# Patient Record
Sex: Female | Born: 1965 | Race: White | Hispanic: No | Smoking: Never smoker
Health system: Southern US, Community
[De-identification: ages and names within clinical notes are randomized; demographics above are authoritative.]

## PROBLEM LIST (undated history)

## (undated) DIAGNOSIS — F419 Anxiety disorder, unspecified: Secondary | ICD-10-CM

## (undated) DIAGNOSIS — F329 Major depressive disorder, single episode, unspecified: Secondary | ICD-10-CM

## (undated) DIAGNOSIS — E039 Hypothyroidism, unspecified: Secondary | ICD-10-CM

## (undated) DIAGNOSIS — F32A Depression, unspecified: Secondary | ICD-10-CM

## (undated) DIAGNOSIS — I10 Essential (primary) hypertension: Secondary | ICD-10-CM

## (undated) HISTORY — PX: GASTRIC BYPASS: SHX52

---

## 2005-04-01 ENCOUNTER — Ambulatory Visit: Payer: Self-pay | Admitting: Internal Medicine

## 2005-04-19 ENCOUNTER — Ambulatory Visit: Payer: Self-pay | Admitting: Internal Medicine

## 2005-05-19 ENCOUNTER — Ambulatory Visit: Payer: Self-pay | Admitting: Internal Medicine

## 2006-07-17 ENCOUNTER — Emergency Department (HOSPITAL_COMMUNITY): Admission: EM | Admit: 2006-07-17 | Discharge: 2006-07-17 | Payer: Self-pay | Admitting: Emergency Medicine

## 2006-07-22 ENCOUNTER — Emergency Department: Payer: Self-pay | Admitting: Emergency Medicine

## 2006-09-10 ENCOUNTER — Inpatient Hospital Stay: Payer: Self-pay | Admitting: Internal Medicine

## 2010-10-13 ENCOUNTER — Emergency Department (HOSPITAL_COMMUNITY)
Admission: EM | Admit: 2010-10-13 | Discharge: 2010-10-14 | Disposition: A | Discharge status: Home / Self Care | Payer: No Typology Code available for payment source | Attending: General Surgery | Admitting: General Surgery

## 2010-10-13 ENCOUNTER — Emergency Department (HOSPITAL_COMMUNITY): Payer: No Typology Code available for payment source

## 2010-10-13 DIAGNOSIS — E119 Type 2 diabetes mellitus without complications: Secondary | ICD-10-CM | POA: Insufficient documentation

## 2010-10-13 DIAGNOSIS — Y992 Volunteer activity: Secondary | ICD-10-CM | POA: Insufficient documentation

## 2010-10-13 DIAGNOSIS — Z9884 Bariatric surgery status: Secondary | ICD-10-CM | POA: Insufficient documentation

## 2010-10-13 DIAGNOSIS — F3289 Other specified depressive episodes: Secondary | ICD-10-CM | POA: Insufficient documentation

## 2010-10-13 DIAGNOSIS — E039 Hypothyroidism, unspecified: Secondary | ICD-10-CM | POA: Insufficient documentation

## 2010-10-13 DIAGNOSIS — N390 Urinary tract infection, site not specified: Secondary | ICD-10-CM | POA: Insufficient documentation

## 2010-10-13 DIAGNOSIS — S0003XA Contusion of scalp, initial encounter: Secondary | ICD-10-CM | POA: Insufficient documentation

## 2010-10-13 DIAGNOSIS — I10 Essential (primary) hypertension: Secondary | ICD-10-CM | POA: Insufficient documentation

## 2010-10-13 DIAGNOSIS — K219 Gastro-esophageal reflux disease without esophagitis: Secondary | ICD-10-CM | POA: Insufficient documentation

## 2010-10-13 DIAGNOSIS — E282 Polycystic ovarian syndrome: Secondary | ICD-10-CM | POA: Insufficient documentation

## 2010-10-13 DIAGNOSIS — F329 Major depressive disorder, single episode, unspecified: Secondary | ICD-10-CM | POA: Insufficient documentation

## 2010-10-13 DIAGNOSIS — S1093XA Contusion of unspecified part of neck, initial encounter: Secondary | ICD-10-CM | POA: Insufficient documentation

## 2010-10-13 DIAGNOSIS — M79609 Pain in unspecified limb: Secondary | ICD-10-CM | POA: Insufficient documentation

## 2010-10-13 DIAGNOSIS — Y92410 Unspecified street and highway as the place of occurrence of the external cause: Secondary | ICD-10-CM | POA: Insufficient documentation

## 2010-10-13 DIAGNOSIS — R109 Unspecified abdominal pain: Secondary | ICD-10-CM | POA: Insufficient documentation

## 2010-10-13 HISTORY — DX: Essential (primary) hypertension: I10

## 2010-10-14 ENCOUNTER — Emergency Department (HOSPITAL_COMMUNITY): Payer: No Typology Code available for payment source

## 2010-10-14 ENCOUNTER — Encounter (HOSPITAL_COMMUNITY): Payer: Self-pay | Admitting: Radiology

## 2010-10-14 LAB — URINALYSIS, ROUTINE W REFLEX MICROSCOPIC
Bilirubin Urine: NEGATIVE
Nitrite: POSITIVE — AB
Protein, ur: NEGATIVE mg/dL
Urobilinogen, UA: 0.2 mg/dL (ref 0.0–1.0)

## 2010-10-14 LAB — POCT I-STAT 3, VENOUS BLOOD GAS (G3P V)
Acid-base deficit: 1 mmol/L (ref 0.0–2.0)
O2 Saturation: 46 %
pCO2, Ven: 50.1 mmHg — ABNORMAL HIGH (ref 45.0–50.0)

## 2010-10-14 LAB — LACTIC ACID, PLASMA: Lactic Acid, Venous: 2.9 mmol/L — ABNORMAL HIGH (ref 0.5–2.2)

## 2010-10-14 LAB — GLUCOSE, CAPILLARY
Glucose-Capillary: 233 mg/dL — ABNORMAL HIGH (ref 70–99)
Glucose-Capillary: 352 mg/dL — ABNORMAL HIGH (ref 70–99)

## 2010-10-14 LAB — CBC
HCT: 29.8 % — ABNORMAL LOW (ref 36.0–46.0)
MCHC: 30.5 g/dL (ref 30.0–36.0)
Platelets: 384 10*3/uL (ref 150–400)
RDW: 16.7 % — ABNORMAL HIGH (ref 11.5–15.5)
WBC: 10 10*3/uL (ref 4.0–10.5)

## 2010-10-14 LAB — COMPREHENSIVE METABOLIC PANEL
AST: 17 U/L (ref 0–37)
Albumin: 2.7 g/dL — ABNORMAL LOW (ref 3.5–5.2)
BUN: 11 mg/dL (ref 6–23)
Calcium: 8.4 mg/dL (ref 8.4–10.5)
Chloride: 101 mEq/L (ref 96–112)
Creatinine, Ser: 0.92 mg/dL (ref 0.50–1.10)
Total Bilirubin: 0.1 mg/dL — ABNORMAL LOW (ref 0.3–1.2)

## 2010-10-14 LAB — DIFFERENTIAL
Basophils Absolute: 0 10*3/uL (ref 0.0–0.1)
Eosinophils Absolute: 0.2 10*3/uL (ref 0.0–0.7)
Eosinophils Relative: 2 % (ref 0–5)
Lymphs Abs: 2.4 10*3/uL (ref 0.7–4.0)
Monocytes Absolute: 0.6 10*3/uL (ref 0.1–1.0)
Neutrophils Relative %: 68 % (ref 43–77)

## 2010-10-14 LAB — POCT I-STAT TROPONIN I: Troponin i, poc: 0.01 ng/mL (ref 0.00–0.08)

## 2010-10-14 LAB — POCT PREGNANCY, URINE: Preg Test, Ur: NEGATIVE

## 2010-10-14 MED ORDER — IOHEXOL 300 MG/ML  SOLN
100.0000 mL | Freq: Once | INTRAMUSCULAR | Status: AC | PRN
  Administered 2010-10-14: 100 mL via INTRAVENOUS

## 2010-10-14 MED ORDER — IOHEXOL 300 MG/ML  SOLN: 100.0000 mL | Freq: Once | INTRAMUSCULAR | Status: DC | PRN

## 2012-05-12 ENCOUNTER — Ambulatory Visit: Payer: Self-pay | Admitting: General Practice

## 2012-05-19 ENCOUNTER — Ambulatory Visit: Payer: Self-pay | Admitting: General Practice

## 2012-06-07 ENCOUNTER — Emergency Department: Payer: Self-pay | Admitting: Emergency Medicine

## 2013-06-21 ENCOUNTER — Emergency Department: Payer: Self-pay | Admitting: Emergency Medicine

## 2013-09-23 ENCOUNTER — Encounter (HOSPITAL_COMMUNITY): Payer: No Typology Code available for payment source | Admitting: Anesthesiology

## 2013-09-23 ENCOUNTER — Inpatient Hospital Stay (HOSPITAL_COMMUNITY): Payer: No Typology Code available for payment source

## 2013-09-23 ENCOUNTER — Encounter (HOSPITAL_COMMUNITY)
Admission: EM | Disposition: A | Discharge status: Skilled Nursing Facility | Payer: Self-pay | Source: Home / Self Care | Attending: Neurosurgery

## 2013-09-23 ENCOUNTER — Inpatient Hospital Stay (HOSPITAL_COMMUNITY): Payer: No Typology Code available for payment source | Admitting: Anesthesiology

## 2013-09-23 ENCOUNTER — Emergency Department (HOSPITAL_COMMUNITY): Payer: No Typology Code available for payment source

## 2013-09-23 ENCOUNTER — Encounter (HOSPITAL_COMMUNITY): Payer: Self-pay | Admitting: Emergency Medicine

## 2013-09-23 ENCOUNTER — Inpatient Hospital Stay (HOSPITAL_COMMUNITY)
Admission: EM | Admit: 2013-09-23 | Discharge: 2013-09-28 | DRG: 460 | Disposition: A | Discharge status: Skilled Nursing Facility | Payer: No Typology Code available for payment source | Attending: Neurosurgery | Admitting: Neurosurgery

## 2013-09-23 DIAGNOSIS — E1065 Type 1 diabetes mellitus with hyperglycemia: Secondary | ICD-10-CM | POA: Diagnosis present

## 2013-09-23 DIAGNOSIS — S1091XA Abrasion of unspecified part of neck, initial encounter: Secondary | ICD-10-CM

## 2013-09-23 DIAGNOSIS — S32019A Unspecified fracture of first lumbar vertebra, initial encounter for closed fracture: Secondary | ICD-10-CM | POA: Diagnosis present

## 2013-09-23 DIAGNOSIS — F329 Major depressive disorder, single episode, unspecified: Secondary | ICD-10-CM

## 2013-09-23 DIAGNOSIS — E039 Hypothyroidism, unspecified: Secondary | ICD-10-CM | POA: Diagnosis present

## 2013-09-23 DIAGNOSIS — Z833 Family history of diabetes mellitus: Secondary | ICD-10-CM

## 2013-09-23 DIAGNOSIS — H3500 Unspecified background retinopathy: Secondary | ICD-10-CM | POA: Diagnosis present

## 2013-09-23 DIAGNOSIS — Z91199 Patient's noncompliance with other medical treatment and regimen due to unspecified reason: Secondary | ICD-10-CM

## 2013-09-23 DIAGNOSIS — Z598 Other problems related to housing and economic circumstances: Secondary | ICD-10-CM

## 2013-09-23 DIAGNOSIS — Z6841 Body Mass Index (BMI) 40.0 and over, adult: Secondary | ICD-10-CM | POA: Diagnosis not present

## 2013-09-23 DIAGNOSIS — Z820 Family history of epilepsy and other diseases of the nervous system: Secondary | ICD-10-CM | POA: Diagnosis not present

## 2013-09-23 DIAGNOSIS — N390 Urinary tract infection, site not specified: Secondary | ICD-10-CM | POA: Diagnosis present

## 2013-09-23 DIAGNOSIS — Z880 Allergy status to penicillin: Secondary | ICD-10-CM

## 2013-09-23 DIAGNOSIS — M542 Cervicalgia: Secondary | ICD-10-CM

## 2013-09-23 DIAGNOSIS — F3289 Other specified depressive episodes: Secondary | ICD-10-CM | POA: Diagnosis present

## 2013-09-23 DIAGNOSIS — I10 Essential (primary) hypertension: Secondary | ICD-10-CM | POA: Diagnosis present

## 2013-09-23 DIAGNOSIS — E1069 Type 1 diabetes mellitus with other specified complication: Secondary | ICD-10-CM

## 2013-09-23 DIAGNOSIS — IMO0002 Reserved for concepts with insufficient information to code with codable children: Secondary | ICD-10-CM | POA: Diagnosis present

## 2013-09-23 DIAGNOSIS — F32A Depression, unspecified: Secondary | ICD-10-CM | POA: Diagnosis present

## 2013-09-23 DIAGNOSIS — Z9119 Patient's noncompliance with other medical treatment and regimen: Secondary | ICD-10-CM

## 2013-09-23 DIAGNOSIS — E162 Hypoglycemia, unspecified: Secondary | ICD-10-CM | POA: Diagnosis present

## 2013-09-23 DIAGNOSIS — S32009A Unspecified fracture of unspecified lumbar vertebra, initial encounter for closed fracture: Secondary | ICD-10-CM | POA: Diagnosis present

## 2013-09-23 DIAGNOSIS — Z9884 Bariatric surgery status: Secondary | ICD-10-CM | POA: Diagnosis not present

## 2013-09-23 DIAGNOSIS — F411 Generalized anxiety disorder: Secondary | ICD-10-CM | POA: Diagnosis present

## 2013-09-23 DIAGNOSIS — E108 Type 1 diabetes mellitus with unspecified complications: Secondary | ICD-10-CM

## 2013-09-23 DIAGNOSIS — Z8249 Family history of ischemic heart disease and other diseases of the circulatory system: Secondary | ICD-10-CM

## 2013-09-23 DIAGNOSIS — Z5987 Material hardship due to limited financial resources, not elsewhere classified: Secondary | ICD-10-CM

## 2013-09-23 DIAGNOSIS — E103299 Type 1 diabetes mellitus with mild nonproliferative diabetic retinopathy without macular edema, unspecified eye: Secondary | ICD-10-CM

## 2013-09-23 HISTORY — DX: Hypothyroidism, unspecified: E03.9

## 2013-09-23 HISTORY — DX: Anxiety disorder, unspecified: F41.9

## 2013-09-23 HISTORY — PX: POSTERIOR LUMBAR FUSION 4 LEVEL: SHX6037

## 2013-09-23 HISTORY — DX: Depression, unspecified: F32.A

## 2013-09-23 HISTORY — DX: Major depressive disorder, single episode, unspecified: F32.9

## 2013-09-23 LAB — BASIC METABOLIC PANEL
Anion gap: 14 (ref 5–15)
BUN: 13 mg/dL (ref 6–23)
CO2: 23 meq/L (ref 19–32)
Calcium: 8.7 mg/dL (ref 8.4–10.5)
Chloride: 95 mEq/L — ABNORMAL LOW (ref 96–112)
Creatinine, Ser: 0.94 mg/dL (ref 0.50–1.10)
GFR calc Af Amer: 84 mL/min — ABNORMAL LOW (ref >90)
GFR, EST NON AFRICAN AMERICAN: 72 mL/min — AB (ref >90)
Glucose, Bld: 258 mg/dL — ABNORMAL HIGH (ref 70–99)
POTASSIUM: 3.8 meq/L (ref 3.7–5.3)
Sodium: 132 mEq/L — ABNORMAL LOW (ref 137–147)

## 2013-09-23 LAB — CBG MONITORING, ED
GLUCOSE-CAPILLARY: 243 mg/dL — AB (ref 70–99)
GLUCOSE-CAPILLARY: 245 mg/dL — AB (ref 70–99)
GLUCOSE-CAPILLARY: 183 mg/dL — AB (ref 70–99)
GLUCOSE-CAPILLARY: 236 mg/dL — AB (ref 70–99)
Glucose-Capillary: 185 mg/dL — ABNORMAL HIGH (ref 70–99)
Glucose-Capillary: 183 mg/dL — ABNORMAL HIGH (ref 70–99)

## 2013-09-23 LAB — CBC WITH DIFFERENTIAL/PLATELET
BASOS ABS: 0 10*3/uL (ref 0.0–0.1)
Basophils Relative: 0 % (ref 0–1)
Eosinophils Absolute: 0 10*3/uL (ref 0.0–0.7)
Eosinophils Relative: 0 % (ref 0–5)
HEMATOCRIT: 32.7 % — AB (ref 36.0–46.0)
Hemoglobin: 10.4 g/dL — ABNORMAL LOW (ref 12.0–15.0)
LYMPHS PCT: 9 % — AB (ref 12–46)
Lymphs Abs: 0.9 10*3/uL (ref 0.7–4.0)
MCH: 22.9 pg — ABNORMAL LOW (ref 26.0–34.0)
MCHC: 31.8 g/dL (ref 30.0–36.0)
MCV: 72 fL — AB (ref 78.0–100.0)
Monocytes Absolute: 0.5 10*3/uL (ref 0.1–1.0)
Monocytes Relative: 4 % (ref 3–12)
NEUTROS ABS: 9.3 10*3/uL — AB (ref 1.7–7.7)
Neutrophils Relative %: 87 % — ABNORMAL HIGH (ref 43–77)
PLATELETS: 363 10*3/uL (ref 150–400)
RBC: 4.54 MIL/uL (ref 3.87–5.11)
RDW: 18.1 % — AB (ref 11.5–15.5)
WBC: 10.8 10*3/uL — AB (ref 4.0–10.5)

## 2013-09-23 LAB — PROTIME-INR
INR: 1.08 (ref 0.00–1.49)
Prothrombin Time: 14 seconds (ref 11.6–15.2)

## 2013-09-23 LAB — PREPARE RBC (CROSSMATCH)

## 2013-09-23 LAB — GLUCOSE, CAPILLARY: GLUCOSE-CAPILLARY: 164 mg/dL — AB (ref 70–99)

## 2013-09-23 LAB — ABO/RH: ABO/RH(D): O POS

## 2013-09-23 SURGERY — POSTERIOR LUMBAR FUSION 4 LEVEL
Anesthesia: General | Site: Back

## 2013-09-23 MED ORDER — BUPIVACAINE HCL (PF) 0.25 % IJ SOLN
INTRAMUSCULAR | Status: DC | PRN
Start: 2013-09-23 — End: 2013-09-23
  Administered 2013-09-23: 20 mL

## 2013-09-23 MED ORDER — ROCURONIUM BROMIDE 50 MG/5ML IV SOLN
INTRAVENOUS | Status: AC
  Filled 2013-09-23: qty 1

## 2013-09-23 MED ORDER — FENTANYL CITRATE 0.05 MG/ML IJ SOLN
INTRAMUSCULAR | Status: AC
  Filled 2013-09-23: qty 5

## 2013-09-23 MED ORDER — NEOSTIGMINE METHYLSULFATE 10 MG/10ML IV SOLN
INTRAVENOUS | Status: DC | PRN
  Administered 2013-09-23: 5 mg via INTRAVENOUS

## 2013-09-23 MED ORDER — PROPOFOL 10 MG/ML IV BOLUS
INTRAVENOUS | Status: DC | PRN
  Administered 2013-09-23: 200 mg via INTRAVENOUS

## 2013-09-23 MED ORDER — LACTATED RINGERS IV SOLN: INTRAVENOUS | Status: DC | PRN

## 2013-09-23 MED ORDER — HYDROCHLOROTHIAZIDE 25 MG PO TABS
25.0000 mg | ORAL_TABLET | Freq: Every day | ORAL | Status: DC
  Administered 2013-09-24 – 2013-09-28 (×5): 25 mg via ORAL
  Filled 2013-09-23 (×5): qty 1

## 2013-09-23 MED ORDER — INSULIN ASPART 100 UNIT/ML ~~LOC~~ SOLN
8.0000 [IU] | Freq: Once | SUBCUTANEOUS | Status: AC
  Administered 2013-09-23: 8 [IU] via SUBCUTANEOUS

## 2013-09-23 MED ORDER — HYDROMORPHONE HCL PF 1 MG/ML IJ SOLN
1.0000 mg | Freq: Once | INTRAMUSCULAR | Status: AC
  Administered 2013-09-23: 1 mg via INTRAVENOUS
  Filled 2013-09-23: qty 1

## 2013-09-23 MED ORDER — INSULIN ASPART 100 UNIT/ML ~~LOC~~ SOLN: 0.0000 [IU] | Freq: Three times a day (TID) | SUBCUTANEOUS | Status: DC

## 2013-09-23 MED ORDER — INSULIN ASPART 100 UNIT/ML ~~LOC~~ SOLN
SUBCUTANEOUS | Status: DC | PRN
  Administered 2013-09-23: 10 [IU] via SUBCUTANEOUS

## 2013-09-23 MED ORDER — SODIUM CHLORIDE 0.9 % IJ SOLN
3.0000 mL | Freq: Two times a day (BID) | INTRAMUSCULAR | Status: DC
  Administered 2013-09-23: 3 mL via INTRAVENOUS

## 2013-09-23 MED ORDER — VECURONIUM BROMIDE 10 MG IV SOLR
INTRAVENOUS | Status: AC
  Filled 2013-09-23: qty 10

## 2013-09-23 MED ORDER — LACTATED RINGERS IV SOLN
INTRAVENOUS | Status: DC | PRN
  Administered 2013-09-23: 15:00:00 via INTRAVENOUS

## 2013-09-23 MED ORDER — MENTHOL 3 MG MT LOZG: 1.0000 | LOZENGE | OROMUCOSAL | Status: DC | PRN

## 2013-09-23 MED ORDER — ROCURONIUM BROMIDE 100 MG/10ML IV SOLN
INTRAVENOUS | Status: DC | PRN
  Administered 2013-09-23: 50 mg via INTRAVENOUS
  Administered 2013-09-23: 30 mg via INTRAVENOUS
  Administered 2013-09-23: 20 mg via INTRAVENOUS

## 2013-09-23 MED ORDER — INSULIN GLARGINE 100 UNIT/ML ~~LOC~~ SOLN
30.0000 [IU] | Freq: Every day | SUBCUTANEOUS | Status: DC
  Administered 2013-09-23: 30 [IU] via SUBCUTANEOUS
  Filled 2013-09-23 (×2): qty 0.3

## 2013-09-23 MED ORDER — ALUM & MAG HYDROXIDE-SIMETH 200-200-20 MG/5ML PO SUSP: 30.0000 mL | Freq: Four times a day (QID) | ORAL | Status: DC | PRN

## 2013-09-23 MED ORDER — DEXTROSE 50 % IV SOLN: 50.0000 mL | Freq: Once | INTRAVENOUS | Status: AC | PRN

## 2013-09-23 MED ORDER — SODIUM CHLORIDE 0.9 % IV SOLN: 250.0000 mL | INTRAVENOUS | Status: DC

## 2013-09-23 MED ORDER — VECURONIUM BROMIDE 10 MG IV SOLR
INTRAVENOUS | Status: DC | PRN
  Administered 2013-09-23 (×2): 2 mg via INTRAVENOUS

## 2013-09-23 MED ORDER — HYDROMORPHONE HCL PF 1 MG/ML IJ SOLN
0.5000 mg | INTRAMUSCULAR | Status: DC | PRN
  Administered 2013-09-23: 1 mg via INTRAVENOUS
  Filled 2013-09-23: qty 1

## 2013-09-23 MED ORDER — OXYCODONE-ACETAMINOPHEN 5-325 MG PO TABS
1.0000 | ORAL_TABLET | ORAL | Status: DC | PRN
  Administered 2013-09-24 – 2013-09-27 (×13): 2 via ORAL
  Administered 2013-09-27: 1 via ORAL
  Administered 2013-09-27 – 2013-09-28 (×3): 2 via ORAL
  Administered 2013-09-28: 1 via ORAL
  Filled 2013-09-23 (×18): qty 2

## 2013-09-23 MED ORDER — SODIUM CHLORIDE 0.9 % IV BOLUS (SEPSIS)
1000.0000 mL | Freq: Once | INTRAVENOUS | Status: AC
  Administered 2013-09-23: 1000 mL via INTRAVENOUS

## 2013-09-23 MED ORDER — PHENOL 1.4 % MT LIQD: 1.0000 | OROMUCOSAL | Status: DC | PRN

## 2013-09-23 MED ORDER — PANTOPRAZOLE SODIUM 40 MG PO TBEC
40.0000 mg | DELAYED_RELEASE_TABLET | Freq: Every day | ORAL | Status: DC
  Administered 2013-09-24 – 2013-09-28 (×5): 40 mg via ORAL
  Filled 2013-09-23 (×5): qty 1

## 2013-09-23 MED ORDER — HYDROMORPHONE HCL PF 1 MG/ML IJ SOLN
INTRAMUSCULAR | Status: AC
  Administered 2013-09-23: 23:00:00
  Filled 2013-09-23: qty 1

## 2013-09-23 MED ORDER — LORATADINE 10 MG PO TABS
10.0000 mg | ORAL_TABLET | Freq: Every day | ORAL | Status: DC
  Administered 2013-09-24 – 2013-09-28 (×5): 10 mg via ORAL
  Filled 2013-09-23 (×5): qty 1

## 2013-09-23 MED ORDER — MIDAZOLAM HCL 2 MG/2ML IJ SOLN
INTRAMUSCULAR | Status: AC
  Filled 2013-09-23: qty 2

## 2013-09-23 MED ORDER — ONDANSETRON HCL 4 MG/2ML IJ SOLN
INTRAMUSCULAR | Status: AC
  Filled 2013-09-23: qty 2

## 2013-09-23 MED ORDER — IOHEXOL 350 MG/ML SOLN
50.0000 mL | Freq: Once | INTRAVENOUS | Status: AC | PRN
  Administered 2013-09-23: 50 mL via INTRAVENOUS

## 2013-09-23 MED ORDER — INSULIN ASPART 100 UNIT/ML ~~LOC~~ SOLN
0.0000 [IU] | Freq: Three times a day (TID) | SUBCUTANEOUS | Status: DC
  Administered 2013-09-24 (×3): 2 [IU] via SUBCUTANEOUS
  Administered 2013-09-25: 7 [IU] via SUBCUTANEOUS
  Administered 2013-09-25: 3 [IU] via SUBCUTANEOUS
  Administered 2013-09-25: 5 [IU] via SUBCUTANEOUS
  Administered 2013-09-26: 7 [IU] via SUBCUTANEOUS

## 2013-09-23 MED ORDER — INSULIN ASPART 100 UNIT/ML ~~LOC~~ SOLN: 2.0000 [IU] | Freq: Four times a day (QID) | SUBCUTANEOUS | Status: DC | PRN

## 2013-09-23 MED ORDER — STERILE WATER FOR INJECTION IJ SOLN
INTRAMUSCULAR | Status: AC
  Filled 2013-09-23: qty 10

## 2013-09-23 MED ORDER — METOCLOPRAMIDE HCL 5 MG/ML IJ SOLN
INTRAMUSCULAR | Status: AC
  Filled 2013-09-23: qty 2

## 2013-09-23 MED ORDER — CYCLOBENZAPRINE HCL 10 MG PO TABS
10.0000 mg | ORAL_TABLET | Freq: Three times a day (TID) | ORAL | Status: DC | PRN
  Administered 2013-09-24 – 2013-09-28 (×9): 10 mg via ORAL
  Filled 2013-09-23 (×10): qty 1

## 2013-09-23 MED ORDER — CEFAZOLIN SODIUM 1-5 GM-% IV SOLN: 1.0000 g | Freq: Three times a day (TID) | INTRAVENOUS | Status: DC

## 2013-09-23 MED ORDER — VANCOMYCIN HCL IN DEXTROSE 1-5 GM/200ML-% IV SOLN
1000.0000 mg | Freq: Two times a day (BID) | INTRAVENOUS | Status: AC
  Administered 2013-09-24 – 2013-09-26 (×5): 1000 mg via INTRAVENOUS
  Filled 2013-09-23 (×5): qty 200

## 2013-09-23 MED ORDER — LIDOCAINE HCL (CARDIAC) 20 MG/ML IV SOLN
INTRAVENOUS | Status: AC
  Filled 2013-09-23: qty 10

## 2013-09-23 MED ORDER — HEMOSTATIC AGENTS (NO CHARGE) OPTIME
TOPICAL | Status: DC | PRN
  Administered 2013-09-23: 1 via TOPICAL

## 2013-09-23 MED ORDER — POTASSIUM CHLORIDE IN NACL 20-0.9 MEQ/L-% IV SOLN
INTRAVENOUS | Status: DC
  Administered 2013-09-23: 23:00:00 via INTRAVENOUS

## 2013-09-23 MED ORDER — INSULIN ASPART 100 UNIT/ML ~~LOC~~ SOLN
0.0000 [IU] | Freq: Three times a day (TID) | SUBCUTANEOUS | Status: DC
  Administered 2013-09-24 – 2013-09-26 (×6): 2 [IU] via SUBCUTANEOUS

## 2013-09-23 MED ORDER — METOCLOPRAMIDE HCL 5 MG/ML IJ SOLN
INTRAMUSCULAR | Status: DC | PRN
  Administered 2013-09-23: 10 mg via INTRAVENOUS

## 2013-09-23 MED ORDER — SODIUM CHLORIDE 0.9 % IR SOLN
Status: DC | PRN
  Administered 2013-09-23: 16:00:00

## 2013-09-23 MED ORDER — GLYCOPYRROLATE 0.2 MG/ML IJ SOLN
INTRAMUSCULAR | Status: AC
  Filled 2013-09-23: qty 2

## 2013-09-23 MED ORDER — ARTIFICIAL TEARS OP OINT
TOPICAL_OINTMENT | OPHTHALMIC | Status: AC
  Filled 2013-09-23: qty 3.5

## 2013-09-23 MED ORDER — THROMBIN 20000 UNITS EX SOLR
CUTANEOUS | Status: DC | PRN
  Administered 2013-09-23: 16:00:00 via TOPICAL

## 2013-09-23 MED ORDER — ONDANSETRON HCL 4 MG/2ML IJ SOLN: 4.0000 mg | INTRAMUSCULAR | Status: DC | PRN

## 2013-09-23 MED ORDER — OXYCODONE HCL 5 MG/5ML PO SOLN: 5.0000 mg | Freq: Once | ORAL | Status: DC | PRN

## 2013-09-23 MED ORDER — LISINOPRIL 10 MG PO TABS
10.0000 mg | ORAL_TABLET | Freq: Every day | ORAL | Status: DC
  Administered 2013-09-24 – 2013-09-28 (×5): 10 mg via ORAL
  Filled 2013-09-23 (×5): qty 1

## 2013-09-23 MED ORDER — ACETAMINOPHEN 650 MG RE SUPP: 650.0000 mg | RECTAL | Status: DC | PRN

## 2013-09-23 MED ORDER — ONDANSETRON HCL 4 MG/2ML IJ SOLN
INTRAMUSCULAR | Status: DC | PRN
  Administered 2013-09-23 (×2): 4 mg via INTRAVENOUS

## 2013-09-23 MED ORDER — VANCOMYCIN HCL IN DEXTROSE 1-5 GM/200ML-% IV SOLN
INTRAVENOUS | Status: AC
  Administered 2013-09-23: 1000 mg via INTRAVENOUS
  Filled 2013-09-23: qty 200

## 2013-09-23 MED ORDER — HYDROMORPHONE HCL PF 1 MG/ML IJ SOLN
0.5000 mg | INTRAMUSCULAR | Status: DC | PRN
  Administered 2013-09-24 – 2013-09-27 (×16): 1 mg via INTRAVENOUS
  Filled 2013-09-23 (×16): qty 1

## 2013-09-23 MED ORDER — ACETAMINOPHEN 325 MG PO TABS: 650.0000 mg | ORAL_TABLET | ORAL | Status: DC | PRN

## 2013-09-23 MED ORDER — DOCUSATE SODIUM 100 MG PO CAPS
100.0000 mg | ORAL_CAPSULE | Freq: Two times a day (BID) | ORAL | Status: DC
  Administered 2013-09-24 – 2013-09-28 (×9): 100 mg via ORAL
  Filled 2013-09-23 (×9): qty 1

## 2013-09-23 MED ORDER — SODIUM CHLORIDE 0.9 % IJ SOLN: 3.0000 mL | INTRAMUSCULAR | Status: DC | PRN

## 2013-09-23 MED ORDER — SUCCINYLCHOLINE CHLORIDE 20 MG/ML IJ SOLN
INTRAMUSCULAR | Status: DC | PRN
  Administered 2013-09-23: 140 mg via INTRAVENOUS

## 2013-09-23 MED ORDER — LIDOCAINE-EPINEPHRINE 1 %-1:100000 IJ SOLN
INTRAMUSCULAR | Status: DC | PRN
  Administered 2013-09-23 (×2): 10 mL via INTRADERMAL

## 2013-09-23 MED ORDER — GLUCOSE 40 % PO GEL: 1.0000 | ORAL | Status: DC | PRN

## 2013-09-23 MED ORDER — ACETAMINOPHEN 325 MG PO TABS
650.0000 mg | ORAL_TABLET | ORAL | Status: DC | PRN
  Administered 2013-09-27: 650 mg via ORAL
  Filled 2013-09-23: qty 2

## 2013-09-23 MED ORDER — SODIUM CHLORIDE 0.9 % IJ SOLN
3.0000 mL | INTRAMUSCULAR | Status: DC | PRN
Start: 2013-09-23 — End: 2013-09-23

## 2013-09-23 MED ORDER — GLYCOPYRROLATE 0.2 MG/ML IJ SOLN
INTRAMUSCULAR | Status: AC
  Filled 2013-09-23: qty 3

## 2013-09-23 MED ORDER — LIDOCAINE HCL (CARDIAC) 20 MG/ML IV SOLN
INTRAVENOUS | Status: DC | PRN
  Administered 2013-09-23: 100 mg via INTRAVENOUS

## 2013-09-23 MED ORDER — PROPOFOL 10 MG/ML IV BOLUS
INTRAVENOUS | Status: AC
  Filled 2013-09-23: qty 20

## 2013-09-23 MED ORDER — NEOSTIGMINE METHYLSULFATE 10 MG/10ML IV SOLN
INTRAVENOUS | Status: AC
  Filled 2013-09-23: qty 1

## 2013-09-23 MED ORDER — HYDROMORPHONE HCL PF 1 MG/ML IJ SOLN
1.0000 mg | Freq: Once | INTRAMUSCULAR | Status: AC
Start: 2013-09-23 — End: 2013-09-23
  Administered 2013-09-23: 1 mg via INTRAVENOUS
  Filled 2013-09-23: qty 1

## 2013-09-23 MED ORDER — HYDROMORPHONE HCL PF 1 MG/ML IJ SOLN
0.2500 mg | INTRAMUSCULAR | Status: DC | PRN
  Administered 2013-09-23 (×4): 0.5 mg via INTRAVENOUS

## 2013-09-23 MED ORDER — ALPRAZOLAM 0.25 MG PO TABS
0.2500 mg | ORAL_TABLET | Freq: Four times a day (QID) | ORAL | Status: DC | PRN
  Administered 2013-09-24 (×2): 0.5 mg via ORAL
  Administered 2013-09-25: 0.25 mg via ORAL
  Administered 2013-09-25: 0.5 mg via ORAL
  Administered 2013-09-25 – 2013-09-26 (×2): 0.25 mg via ORAL
  Administered 2013-09-26: 0.5 mg via ORAL
  Administered 2013-09-27: 0.25 mg via ORAL
  Administered 2013-09-27 (×2): 0.5 mg via ORAL
  Filled 2013-09-23 (×5): qty 2
  Filled 2013-09-23 (×3): qty 1
  Filled 2013-09-23: qty 2
  Filled 2013-09-23: qty 1

## 2013-09-23 MED ORDER — PHENYLEPHRINE 40 MCG/ML (10ML) SYRINGE FOR IV PUSH (FOR BLOOD PRESSURE SUPPORT)
PREFILLED_SYRINGE | INTRAVENOUS | Status: AC
  Filled 2013-09-23: qty 20

## 2013-09-23 MED ORDER — INSULIN ASPART 100 UNIT/ML ~~LOC~~ SOLN: 0.0000 [IU] | Freq: Every day | SUBCUTANEOUS | Status: DC

## 2013-09-23 MED ORDER — FENTANYL CITRATE 0.05 MG/ML IJ SOLN
INTRAMUSCULAR | Status: DC | PRN
  Administered 2013-09-23: 250 ug via INTRAVENOUS
  Administered 2013-09-23 (×2): 50 ug via INTRAVENOUS
  Administered 2013-09-23: 100 ug via INTRAVENOUS
  Administered 2013-09-23: 50 ug via INTRAVENOUS
  Administered 2013-09-23 (×2): 100 ug via INTRAVENOUS
  Administered 2013-09-23: 50 ug via INTRAVENOUS

## 2013-09-23 MED ORDER — INSULIN ASPART 100 UNIT/ML ~~LOC~~ SOLN
SUBCUTANEOUS | Status: AC
  Administered 2013-09-23: 8 [IU] via SUBCUTANEOUS
  Filled 2013-09-23: qty 1

## 2013-09-23 MED ORDER — SODIUM CHLORIDE 0.9 % IV SOLN
INTRAVENOUS | Status: DC | PRN
  Administered 2013-09-23: 14:00:00 via INTRAVENOUS

## 2013-09-23 MED ORDER — DEXTROSE 50 % IV SOLN: 25.0000 mL | Freq: Once | INTRAVENOUS | Status: AC | PRN

## 2013-09-23 MED ORDER — MIDAZOLAM HCL 5 MG/5ML IJ SOLN
INTRAMUSCULAR | Status: DC | PRN
  Administered 2013-09-23: 2 mg via INTRAVENOUS

## 2013-09-23 MED ORDER — BUSPIRONE HCL 10 MG PO TABS
30.0000 mg | ORAL_TABLET | Freq: Two times a day (BID) | ORAL | Status: DC
  Administered 2013-09-24 – 2013-09-28 (×9): 30 mg via ORAL
  Filled 2013-09-23 (×10): qty 3

## 2013-09-23 MED ORDER — 0.9 % SODIUM CHLORIDE (POUR BTL) OPTIME
TOPICAL | Status: DC | PRN
  Administered 2013-09-23: 1000 mL

## 2013-09-23 MED ORDER — GLYCOPYRROLATE 0.2 MG/ML IJ SOLN
INTRAMUSCULAR | Status: DC | PRN
  Administered 2013-09-23: 0.6 mg via INTRAVENOUS

## 2013-09-23 MED ORDER — SODIUM CHLORIDE 0.9 % IJ SOLN
3.0000 mL | Freq: Two times a day (BID) | INTRAMUSCULAR | Status: DC
  Administered 2013-09-24 – 2013-09-28 (×4): 3 mL via INTRAVENOUS

## 2013-09-23 MED ORDER — ARTIFICIAL TEARS OP OINT
TOPICAL_OINTMENT | OPHTHALMIC | Status: DC | PRN
  Administered 2013-09-23: 1 via OPHTHALMIC

## 2013-09-23 MED ORDER — OXYCODONE HCL 5 MG PO TABS: 5.0000 mg | ORAL_TABLET | Freq: Once | ORAL | Status: DC | PRN

## 2013-09-23 MED ORDER — POTASSIUM CHLORIDE IN NACL 20-0.9 MEQ/L-% IV SOLN
INTRAVENOUS | Status: DC
  Filled 2013-09-23 (×2): qty 1000

## 2013-09-23 MED ORDER — SODIUM CHLORIDE 0.9 % IV SOLN
Freq: Once | INTRAVENOUS | Status: AC
  Administered 2013-09-23: 11:00:00 via INTRAVENOUS

## 2013-09-23 MED ORDER — ZOLPIDEM TARTRATE 5 MG PO TABS
5.0000 mg | ORAL_TABLET | Freq: Every evening | ORAL | Status: DC | PRN
Start: 2013-09-23 — End: 2013-09-28
  Administered 2013-09-24 – 2013-09-27 (×5): 5 mg via ORAL
  Filled 2013-09-23 (×5): qty 1

## 2013-09-23 MED ORDER — DEXTROSE 5 % IV SOLN
INTRAVENOUS | Status: DC | PRN
  Administered 2013-09-23: 15:00:00 via INTRAVENOUS

## 2013-09-23 SURGICAL SUPPLY — 75 items
ALLOSTEM STRIP 20MMX50MM (Tissue) ×6 IMPLANT
BAG DECANTER FOR FLEXI CONT (MISCELLANEOUS) ×3 IMPLANT
BENZOIN TINCTURE PRP APPL 2/3 (GAUZE/BANDAGES/DRESSINGS) ×6 IMPLANT
BLADE SURG 11 STRL SS (BLADE) IMPLANT
BLADE SURG ROTATE 9660 (MISCELLANEOUS) IMPLANT
BRUSH SCRUB EZ PLAIN DRY (MISCELLANEOUS) ×3 IMPLANT
BUR MATCHSTICK NEURO 3.0 LAGG (BURR) ×3 IMPLANT
BUR PRECISION FLUTE 6.0 (BURR) ×3 IMPLANT
CANISTER SUCT 3000ML (MISCELLANEOUS) ×3 IMPLANT
CAP REVERE LOCKING (Cap) ×30 IMPLANT
CLOSURE WOUND 1/2 X4 (GAUZE/BANDAGES/DRESSINGS) ×2
CONN CROSSLINK REV 38-50MM (Connector) ×3 IMPLANT
CONNECTOR CRSLINK REV 38-50MM (Connector) ×1 IMPLANT
CONT SPEC 4OZ CLIKSEAL STRL BL (MISCELLANEOUS) ×6 IMPLANT
COVER BACK TABLE 24X17X13 BIG (DRAPES) IMPLANT
COVER TABLE BACK 60X90 (DRAPES) ×3 IMPLANT
DECANTER SPIKE VIAL GLASS SM (MISCELLANEOUS) ×3 IMPLANT
DERMABOND ADVANCED (GAUZE/BANDAGES/DRESSINGS) ×4
DERMABOND ADVANCED .7 DNX12 (GAUZE/BANDAGES/DRESSINGS) ×2 IMPLANT
DRAPE C-ARM 42X72 X-RAY (DRAPES) ×6 IMPLANT
DRAPE C-ARMOR (DRAPES) ×3 IMPLANT
DRAPE LAPAROTOMY 100X72X124 (DRAPES) ×3 IMPLANT
DRAPE POUCH INSTRU U-SHP 10X18 (DRAPES) ×3 IMPLANT
DRAPE PROXIMA HALF (DRAPES) IMPLANT
DRAPE SURG 17X23 STRL (DRAPES) ×3 IMPLANT
DRSG OPSITE 4X5.5 SM (GAUZE/BANDAGES/DRESSINGS) ×6 IMPLANT
DRSG OPSITE POSTOP 4X10 (GAUZE/BANDAGES/DRESSINGS) ×3 IMPLANT
DRSG OPSITE POSTOP 4X6 (GAUZE/BANDAGES/DRESSINGS) ×3 IMPLANT
DURAPREP 26ML APPLICATOR (WOUND CARE) ×3 IMPLANT
ELECT REM PT RETURN 9FT ADLT (ELECTROSURGICAL) ×3
ELECTRODE REM PT RTRN 9FT ADLT (ELECTROSURGICAL) ×1 IMPLANT
EVACUATOR 3/16  PVC DRAIN (DRAIN) ×2
EVACUATOR 3/16 PVC DRAIN (DRAIN) ×1 IMPLANT
GAUZE SPONGE 4X4 12PLY STRL (GAUZE/BANDAGES/DRESSINGS) ×6 IMPLANT
GAUZE SPONGE 4X4 16PLY XRAY LF (GAUZE/BANDAGES/DRESSINGS) ×3 IMPLANT
GLOVE BIO SURGEON STRL SZ 6.5 (GLOVE) ×4 IMPLANT
GLOVE BIO SURGEON STRL SZ7 (GLOVE) ×9 IMPLANT
GLOVE BIO SURGEON STRL SZ8 (GLOVE) ×6 IMPLANT
GLOVE BIO SURGEONS STRL SZ 6.5 (GLOVE) ×2
GLOVE EXAM NITRILE LRG STRL (GLOVE) IMPLANT
GLOVE EXAM NITRILE MD LF STRL (GLOVE) IMPLANT
GLOVE EXAM NITRILE XL STR (GLOVE) IMPLANT
GLOVE EXAM NITRILE XS STR PU (GLOVE) IMPLANT
GLOVE INDICATOR 7.5 STRL GRN (GLOVE) ×3 IMPLANT
GLOVE INDICATOR 8.5 STRL (GLOVE) ×6 IMPLANT
GOWN STRL REUS W/ TWL LRG LVL3 (GOWN DISPOSABLE) ×2 IMPLANT
GOWN STRL REUS W/ TWL XL LVL3 (GOWN DISPOSABLE) ×2 IMPLANT
GOWN STRL REUS W/TWL 2XL LVL3 (GOWN DISPOSABLE) IMPLANT
GOWN STRL REUS W/TWL LRG LVL3 (GOWN DISPOSABLE) ×4
GOWN STRL REUS W/TWL XL LVL3 (GOWN DISPOSABLE) ×4
KIT BASIN OR (CUSTOM PROCEDURE TRAY) ×3 IMPLANT
KIT ROOM TURNOVER OR (KITS) ×3 IMPLANT
NEEDLE HYPO 25X1 1.5 SAFETY (NEEDLE) ×3 IMPLANT
NS IRRIG 1000ML POUR BTL (IV SOLUTION) ×3 IMPLANT
PACK LAMINECTOMY NEURO (CUSTOM PROCEDURE TRAY) ×3 IMPLANT
PAD ARMBOARD 7.5X6 YLW CONV (MISCELLANEOUS) ×12 IMPLANT
ROD REVERE LOCKING 6.35 (Rod) ×3 IMPLANT
ROD SPINE HEX 6.35X200 (Rod) ×3 IMPLANT
SCREW REVERE 5.5X45 (Screw) ×12 IMPLANT
SCREW REVERE 6.35 5.5X40MM (Screw) ×6 IMPLANT
SCREW REVERE 6.35 6.5MMX45 (Screw) ×12 IMPLANT
SPONGE LAP 4X18 X RAY DECT (DISPOSABLE) IMPLANT
SPONGE SURGIFOAM ABS GEL 100 (HEMOSTASIS) ×6 IMPLANT
STRIP BIOACTIVE 10CC 25X100X4 (Miscellaneous) ×6 IMPLANT
STRIP CLOSURE SKIN 1/2X4 (GAUZE/BANDAGES/DRESSINGS) ×4 IMPLANT
SUT VIC AB 0 CT1 18XCR BRD8 (SUTURE) ×3 IMPLANT
SUT VIC AB 0 CT1 8-18 (SUTURE) ×6
SUT VIC AB 2-0 CT1 18 (SUTURE) ×9 IMPLANT
SUT VICRYL 4-0 PS2 18IN ABS (SUTURE) ×6 IMPLANT
SYR 20ML ECCENTRIC (SYRINGE) ×3 IMPLANT
TAPE STRIPS DRAPE STRL (GAUZE/BANDAGES/DRESSINGS) ×6 IMPLANT
TOWEL OR 17X24 6PK STRL BLUE (TOWEL DISPOSABLE) ×3 IMPLANT
TOWEL OR 17X26 10 PK STRL BLUE (TOWEL DISPOSABLE) ×3 IMPLANT
TRAY FOLEY CATH 14FRSI W/METER (CATHETERS) ×3 IMPLANT
WATER STERILE IRR 1000ML POUR (IV SOLUTION) ×3 IMPLANT
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
## 2013-09-23 NOTE — Anesthesia Preprocedure Evaluation (Signed)
Anesthesia Evaluation  Patient identified by MRN, date of birth, ID band Patient awake    Reviewed: Allergy & Precautions, H&P , NPO status , Patient's Chart, lab work & pertinent test results  Airway       Dental   Pulmonary          Cardiovascular hypertension, Pt. on medications     Neuro/Psych Anxiety Depression    GI/Hepatic   Endo/Other  diabetesHypothyroidism   Renal/GU      Musculoskeletal   Abdominal   Peds  Hematology   Anesthesia Other Findings   Reproductive/Obstetrics                           Anesthesia Physical Anesthesia Plan  ASA: III  Anesthesia Plan: General   Post-op Pain Management:    Induction: Intravenous  Airway Management Planned: Oral ETT  Additional Equipment: Arterial line  Intra-op Plan:   Post-operative Plan: Extubation in OR  Informed Consent: I have reviewed the patients History and Physical, chart, labs and discussed the procedure including the risks, benefits and alternatives for the proposed anesthesia with the patient or authorized representative who has indicated his/her understanding and acceptance.   Dental advisory given  Plan Discussed with: CRNA, Anesthesiologist and Surgeon  Anesthesia Plan Comments:         Anesthesia Quick Evaluation
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
## 2013-09-23 NOTE — Progress Notes (Signed)
Patient receved from PACU. Patient alert  easy to arouse alert and orientedX4  Dressing to back CDI rates pain at a 2. Positioned and oriented to room and unit.

## 2013-09-23 NOTE — ED Notes (Signed)
CBG 245. 
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
## 2013-09-23 NOTE — ED Notes (Addendum)
Found patient standing up at the bottom of the bed leaning over the bed.  States she cannot get comfortable. All leads removed, BP cuff off and pulse ox off by patient.   Offered recliner but patient states nothing is going to make it better.

## 2013-09-23 NOTE — ED Notes (Signed)
Pt transported to neuro OR, consent signed, has no further questions at the time. Vital signs stable. Belongings given to friend at bedside.

## 2013-09-23 NOTE — ED Notes (Signed)
Pt states she does not want to go to surgery at the time. Dr. [name] updated, to come speak with patient. Vital signs stable. 4/10 back pain at present. No signs of distress noted at the time.
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
## 2013-09-23 NOTE — Anesthesia Postprocedure Evaluation (Signed)
  Anesthesia Post-op Note  Patient: Susan Dunn  Procedure(s) Performed: Procedure(s): POSTERIOR Spinal FUSION  T10-L3, with harvesting of iliac crest bonegraft (N/A)  Patient Location: PACU  Anesthesia Type:General  Level of Consciousness: awake  Airway and Oxygen Therapy: Patient Spontanous Breathing  Post-op Pain: mild  Post-op Assessment: Post-op Vital signs reviewed, Patient's Cardiovascular Status Stable, Respiratory Function Stable, Patent Airway, No signs of Nausea or vomiting and Pain level controlled  Post-op Vital Signs: Reviewed and stable  Last Vitals:  Filed Vitals:   09/23/13 2115  BP:   Pulse:   Temp:   Resp: 25    Complications: No apparent anesthesia complications

## 2013-09-23 NOTE — Progress Notes (Signed)
Called to get report, but was unsuccessful at this, will continue to attempt.

## 2013-09-23 NOTE — ED Notes (Signed)
All belongings given to Toniann Fail (friend) at bedside.
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
## 2013-09-23 NOTE — ED Notes (Signed)
Rosey Bath (sister) [phone] Lollie Sails (brother) [phone] Toniann Fail (work Merchandiser, retail) [phone]

## 2013-09-23 NOTE — Transfer of Care (Signed)
Immediate Anesthesia Transfer of Care Note  Patient: Susan Dunn  Procedure(s) Performed: Procedure(s): POSTERIOR Spinal FUSION  T10-L3, with harvesting of iliac crest bonegraft (N/A)  Patient Location: PACU  Anesthesia Type:General  Level of Consciousness: oriented, sedated, patient cooperative and responds to stimulation  Airway & Oxygen Therapy: Patient Spontanous Breathing and Patient connected to nasal cannula oxygen  Post-op Assessment: Report given to PACU RN, Post -op Vital signs reviewed and stable, Patient moving all extremities and Patient moving all extremities X 4  Post vital signs: Reviewed and stable  Complications: No apparent anesthesia complications

## 2013-09-24 ENCOUNTER — Encounter (HOSPITAL_COMMUNITY): Payer: Self-pay | Admitting: *Deleted

## 2013-09-24 DIAGNOSIS — E11329 Type 2 diabetes mellitus with mild nonproliferative diabetic retinopathy without macular edema: Secondary | ICD-10-CM

## 2013-09-24 DIAGNOSIS — E1039 Type 1 diabetes mellitus with other diabetic ophthalmic complication: Secondary | ICD-10-CM

## 2013-09-24 DIAGNOSIS — F411 Generalized anxiety disorder: Secondary | ICD-10-CM | POA: Diagnosis present

## 2013-09-24 DIAGNOSIS — E039 Hypothyroidism, unspecified: Secondary | ICD-10-CM

## 2013-09-24 DIAGNOSIS — N390 Urinary tract infection, site not specified: Secondary | ICD-10-CM

## 2013-09-24 DIAGNOSIS — E1065 Type 1 diabetes mellitus with hyperglycemia: Secondary | ICD-10-CM

## 2013-09-24 DIAGNOSIS — E162 Hypoglycemia, unspecified: Secondary | ICD-10-CM | POA: Diagnosis present

## 2013-09-24 DIAGNOSIS — F32A Depression, unspecified: Secondary | ICD-10-CM | POA: Diagnosis present

## 2013-09-24 DIAGNOSIS — F329 Major depressive disorder, single episode, unspecified: Secondary | ICD-10-CM | POA: Diagnosis present

## 2013-09-24 LAB — TYPE AND SCREEN
ABO/RH(D): O POS
ANTIBODY SCREEN: NEGATIVE
Unit division: 0
Unit division: 0

## 2013-09-24 LAB — GLUCOSE, CAPILLARY
GLUCOSE-CAPILLARY: 185 mg/dL — AB (ref 70–99)
GLUCOSE-CAPILLARY: 151 mg/dL — AB (ref 70–99)
GLUCOSE-CAPILLARY: 110 mg/dL — AB (ref 70–99)
Glucose-Capillary: 154 mg/dL — ABNORMAL HIGH (ref 70–99)

## 2013-09-24 LAB — TSH: TSH: 19.44 u[IU]/mL — ABNORMAL HIGH (ref 0.350–4.500)

## 2013-09-24 LAB — PRO B NATRIURETIC PEPTIDE: Pro B Natriuretic peptide (BNP): 194.1 pg/mL — ABNORMAL HIGH (ref 0–125)

## 2013-09-24 MED ORDER — ALPRAZOLAM 0.25 MG PO TABS
0.2500 mg | ORAL_TABLET | Freq: Three times a day (TID) | ORAL | Status: DC
  Administered 2013-09-25 – 2013-09-27 (×9): 0.25 mg via ORAL
  Filled 2013-09-24 (×10): qty 1

## 2013-09-24 MED ORDER — LEVOTHYROXINE SODIUM 100 MCG PO TABS
200.0000 ug | ORAL_TABLET | Freq: Every day | ORAL | Status: DC
  Administered 2013-09-24 – 2013-09-28 (×5): 200 ug via ORAL
  Filled 2013-09-24 (×5): qty 2

## 2013-09-24 MED ORDER — CITALOPRAM HYDROBROMIDE 40 MG PO TABS
40.0000 mg | ORAL_TABLET | Freq: Every day | ORAL | Status: DC
Start: 2013-09-24 — End: 2013-09-26
  Administered 2013-09-24 – 2013-09-26 (×3): 40 mg via ORAL
  Filled 2013-09-24 (×3): qty 1

## 2013-09-24 MED ORDER — CITALOPRAM HYDROBROMIDE 10 MG PO TABS
5.0000 mg | ORAL_TABLET | Freq: Every day | ORAL | Status: DC
  Administered 2013-09-24 – 2013-09-26 (×3): 5 mg via ORAL
  Filled 2013-09-24 (×3): qty 1

## 2013-09-24 MED ORDER — CIPROFLOXACIN IN D5W 400 MG/200ML IV SOLN
400.0000 mg | Freq: Two times a day (BID) | INTRAVENOUS | Status: DC
  Administered 2013-09-24 – 2013-09-26 (×5): 400 mg via INTRAVENOUS
  Filled 2013-09-24 (×5): qty 200

## 2013-09-24 MED ORDER — ALPRAZOLAM 0.25 MG PO TABS: 0.2500 mg | ORAL_TABLET | Freq: Three times a day (TID) | ORAL | Status: DC

## 2013-09-24 MED ORDER — INSULIN GLARGINE 100 UNIT/ML ~~LOC~~ SOLN
25.0000 [IU] | Freq: Every day | SUBCUTANEOUS | Status: DC
  Administered 2013-09-24 – 2013-09-25 (×2): 25 [IU] via SUBCUTANEOUS
  Filled 2013-09-24 (×3): qty 0.25

## 2013-09-24 MED ORDER — LORAZEPAM 2 MG/ML IJ SOLN
0.5000 mg | Freq: Once | INTRAMUSCULAR | Status: AC
  Administered 2013-09-24: 0.5 mg via INTRAVENOUS
  Filled 2013-09-24: qty 1
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
## 2013-09-24 NOTE — Progress Notes (Signed)
Patient wanted to know why she is on vanc, MD'S office paged to notify.

## 2013-09-24 NOTE — Progress Notes (Signed)
Orthopedic Tech Progress Note Patient Details:  Susan Dunn 02/23/1965 [id number] Called in order to outside vendor. Patient ID: Susan Dunn, female   DOB: March 22, 1965, 48 y.o.   MRN: [id number]   Susan Dunn 09/24/2013, 11:08 AM

## 2013-09-24 NOTE — Progress Notes (Signed)
Patient ID: Susan Dunn, female   DOB: 1965/03/27, 48 y.o.   MRN: [id number] Patient doing very well pain her back well controlled no new numbness or tingling or legs  Strength out of 5 wound is clean and dry  Mobilize physical therapy after we get her tlso
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
## 2013-09-24 NOTE — Progress Notes (Signed)
PT Cancellation Note  Patient Details Name: Naysha Sholl MRN: [id number] DOB: [birth year]   Cancelled Treatment:    Reason Eval/Treat Not Completed: Other (comment) (waiting on TLSO to mobilize).  TLSO has been ordered and the appropriate people called.  PT will await arrival of TLSO brace before mobilizing.   Thanks,    Rollene Rotunda. Voula Waln, PT, DPT [phone]   09/24/2013, 11:41 AM
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
## 2013-09-25 ENCOUNTER — Encounter (HOSPITAL_COMMUNITY): Payer: Self-pay | Admitting: Physical Medicine and Rehabilitation

## 2013-09-25 LAB — GLUCOSE, CAPILLARY
GLUCOSE-CAPILLARY: 301 mg/dL — AB (ref 70–99)
GLUCOSE-CAPILLARY: 304 mg/dL — AB (ref 70–99)
Glucose-Capillary: 233 mg/dL — ABNORMAL HIGH (ref 70–99)
Glucose-Capillary: 290 mg/dL — ABNORMAL HIGH (ref 70–99)
Glucose-Capillary: 253 mg/dL — ABNORMAL HIGH (ref 70–99)

## 2013-09-25 NOTE — Progress Notes (Signed)
Subjective: Patient reports doing well  Objective: Vital signs in last 24 hours: Temp:  [98.3 F (36.8 C)-99.7 F (37.6 C)] 98.3 F (36.8 C) (09/07 0949) Pulse Rate:  [88-109] 103 (09/07 0949) Resp:  [16-20] 20 (09/07 0949) BP: (103-123)/(43-63) 123/63 mmHg (09/07 0949) SpO2:  [90 %-99 %] 92 % (09/07 0949)  Intake/Output from previous day: 09/06 0701 - 09/07 0700 In: 240 [P.O.:240] Out: 130 [Drains:130] Intake/Output this shift: Total I/O In: -  Out: 1130 [Urine:950; Drains:180]  Physical Exam: Full strength in legs.  Up to chair in brace.  Lab Results:  Recent Labs  09/23/13 0300  WBC 10.8*  HGB 10.4*  HCT 32.7*  PLT 363   BMET  Recent Labs  09/23/13 0300  NA 132*  K 3.8  CL 95*  CO2 23  GLUCOSE 258*  BUN 13  CREATININE 0.94  CALCIUM 8.7    Studies/Results: Dg Lumbar Spine 2-3 Views  09/23/2013   CLINICAL DATA:  L1 burst fracture.  EXAM: LUMBAR SPINE - 2-3 VIEW  COMPARISON:  Lumbar spine CT on 09/23/2013  FINDINGS: Multiple intraoperative spot films show placement of bilateral pedicle screws at levels of T11, T12, L2, and L3. L1 vertebral body compression fracture again demonstrated.  IMPRESSION: Intraoperative placement of bilateral pedicle screws at levels of T11, T12, L2, and L3.   Electronically Signed   By: Myles Rosenthal M.D.   On: 09/23/2013 18:55    Assessment/Plan: Continue to mobilize with PT.  Doing well.    LOS: 2 days    Dorian Heckle, MD 09/25/2013, 12:39 PM

## 2013-09-25 NOTE — Progress Notes (Signed)
Received prescreen request for possible inpatient rehab from PT. I have noted that evaluation of further mobility was limited due to current TLSO being too small (only performed log rolling). I also noted that OT has recommended SNF.   I will follow pt's progress today and see how she progresses with therapies, hopefully with a different TLSO. At that time, I will determine if pursuing inpatient rehab consult is appropriate.   Please call me with any questions. Thanks.  Juliann Mule, PT Rehabilitation Admissions Coordinator [phone]

## 2013-09-25 NOTE — Clinical Social Work Placement (Addendum)
Clinical Social Work Department CLINICAL SOCIAL WORK PLACEMENT NOTE 09/25/2013  Patient:  Susan Dunn, Susan Dunn  Account Number:  [id number] Admit date:  09/23/2013  Clinical Social Worker:  Mosie Epstein  Date/time:  09/25/2013 02:11 PM  Clinical Social Work is seeking post-discharge placement for this patient at the following level of care:   SKILLED NURSING   (*CSW will update this form in Epic as items are completed)   09/25/2013  Patient/family provided with [facility] Department of Clinical Social Work's list of facilities offering this level of care within the geographic area requested by the patient (or if unable, by the patient's family).  09/25/2013  Patient/family informed of their freedom to choose among providers that offer the needed level of care, that participate in Medicare, Medicaid or managed care program needed by the patient, have an available bed and are willing to accept the patient.  09/25/2013  Patient/family informed of [facility]' ownership interest in [facility], as well as of the fact that they are under no obligation to receive care at this facility.  PASARR submitted to EDS on 09/25/2013 PASARR number received on 09/25/2013  FL2 transmitted to all facilities in geographic area requested by pt/family on  09/25/2013 FL2 transmitted to all facilities within larger geographic area on 09/25/2013  Patient informed that his/her managed care company has contracts with or will negotiate with  certain facilities, including the following:     Patient/family informed of bed offers received:  09/26/2013 Patient chooses bed at [facility]  Physician recommends and patient chooses bed at    Patient to be transferred to [facility] on 09/28/2013   Patient to be transferred to facility by [facility] Patient and family notified of transfer on 09/28/2013   Name of family member notified:  Pt reported that she would like to notify her family.    The following physician request were entered in Epic:   Additional Comments:  Marcelline Deist, MSW, [facility] Licensed Clinical Social Worker [phone] and [phone]

## 2013-09-25 NOTE — Progress Notes (Signed)
Pt requests her foley be continued until she gets more comfortable getting up with PT.
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
## 2013-09-25 NOTE — Progress Notes (Signed)
Rehab Admissions Coordinator Note:  Patient was screened by Maicey Barrientez L for appropriateness for an Inpatient Acute Rehab Consult.  At this time, we are recommending Inpatient Rehab consult. I made note of today's activity with PT and that CIR is continued to be recommended.  Analiah Drum L 09/25/2013, 11:39 AM  I can be reached at [phone].

## 2013-09-25 NOTE — Progress Notes (Signed)
UR complete.  Dionisio Aragones RN, MSN 

## 2013-09-25 NOTE — Clinical Social Work Psychosocial (Signed)
Clinical Social Work Department BRIEF PSYCHOSOCIAL ASSESSMENT 09/25/2013  Patient:  Susan Dunn, Susan Dunn     Account Number:  [id number]     Admit date:  09/23/2013  Clinical Social Worker:  Delrae Sawyers  Date/Time:  09/25/2013 02:06 PM  Referred by:  Physician  Date Referred:  09/25/2013 Referred for  SNF Placement  Other - See comment   Other Referral:   CIR   Interview type:  Patient Other interview type:   none.    PSYCHOSOCIAL DATA Living Status:  ALONE Admitted from facility:   Level of care:   Primary support name:  none specific. Primary support relationship to patient:  FRIEND Degree of support available:   Pt states she has lack of support system. Pt states she has three friends Abigail Butts only name provided).    CURRENT CONCERNS Current Concerns  Post-Acute Placement  Other - See comment   Other Concerns:   Lack of support system.    SOCIAL WORK ASSESSMENT / PLAN CSW met with pt at bedside to discuss an alternative plan to CIR. Pt stated she is agreeable to SNF placement as an alternative to CIR. Pt reports she is from home alone and lacks a support system. Pt informed CSW pt's siblings (brother and sister) live out of state, pt's parents have "passed on," and pt's ex-husband does not provide support. Pt informed CSW pt has two sons, ages 64 and 49.    Pt states she would prefer SNF placement in [facility] as she is from the area. Pt expressed preference for CIR, but will agree to SNF if needed. CSW to continue to follow and assist with discharge planning needs.   Assessment/plan status:  Psychosocial Support/Ongoing Assessment of Needs Other assessment/ plan:   none.   Information/referral to community resources:   CIR consult. [facility] SNF bed offers.    PATIENT'S/FAMILY'S RESPONSE TO PLAN OF CARE: Pt understanding and agreeable to CSW plan of care. Pt expressed no further questions or concerns at this time.       Lubertha Sayres, MSW,  [facility] Licensed Clinical Social Worker [phone] and [phone]
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
## 2013-09-26 ENCOUNTER — Encounter (HOSPITAL_COMMUNITY): Payer: Self-pay | Admitting: Neurosurgery

## 2013-09-26 LAB — BASIC METABOLIC PANEL
Anion gap: 13 (ref 5–15)
BUN: 7 mg/dL (ref 6–23)
CO2: 27 mEq/L (ref 19–32)
Calcium: 9.2 mg/dL (ref 8.4–10.5)
Chloride: 94 mEq/L — ABNORMAL LOW (ref 96–112)
Creatinine, Ser: 0.93 mg/dL (ref 0.50–1.10)
GFR calc Af Amer: 85 mL/min — ABNORMAL LOW (ref >90)
GFR, EST NON AFRICAN AMERICAN: 73 mL/min — AB (ref >90)
Glucose, Bld: 270 mg/dL — ABNORMAL HIGH (ref 70–99)
Potassium: 4.4 mEq/L (ref 3.7–5.3)
Sodium: 134 mEq/L — ABNORMAL LOW (ref 137–147)

## 2013-09-26 LAB — HEMOGLOBIN A1C
HEMOGLOBIN A1C: 8.9 % — AB (ref <5.7)
Mean Plasma Glucose: 209 mg/dL — ABNORMAL HIGH (ref <117)

## 2013-09-26 LAB — CBC
HCT: 31 % — ABNORMAL LOW (ref 36.0–46.0)
HEMOGLOBIN: 9.5 g/dL — AB (ref 12.0–15.0)
MCH: 22.3 pg — AB (ref 26.0–34.0)
MCHC: 30.6 g/dL (ref 30.0–36.0)
MCV: 72.8 fL — ABNORMAL LOW (ref 78.0–100.0)
Platelets: 378 10*3/uL (ref 150–400)
RBC: 4.26 MIL/uL (ref 3.87–5.11)
RDW: 18.2 % — ABNORMAL HIGH (ref 11.5–15.5)
WBC: 9.6 10*3/uL (ref 4.0–10.5)

## 2013-09-26 LAB — GLUCOSE, CAPILLARY
GLUCOSE-CAPILLARY: 244 mg/dL — AB (ref 70–99)
GLUCOSE-CAPILLARY: 200 mg/dL — AB (ref 70–99)
Glucose-Capillary: 331 mg/dL — ABNORMAL HIGH (ref 70–99)
Glucose-Capillary: 256 mg/dL — ABNORMAL HIGH (ref 70–99)

## 2013-09-26 MED ORDER — CITALOPRAM HYDROBROMIDE 40 MG PO TABS
60.0000 mg | ORAL_TABLET | Freq: Every day | ORAL | Status: DC
  Administered 2013-09-27: 60 mg via ORAL
  Filled 2013-09-26: qty 1
  Filled 2013-09-26: qty 2

## 2013-09-26 MED ORDER — INSULIN ASPART 100 UNIT/ML ~~LOC~~ SOLN: 0.0000 [IU] | Freq: Three times a day (TID) | SUBCUTANEOUS | Status: DC

## 2013-09-26 MED ORDER — INSULIN ASPART 100 UNIT/ML ~~LOC~~ SOLN
0.0000 [IU] | Freq: Three times a day (TID) | SUBCUTANEOUS | Status: DC
  Administered 2013-09-26: 11 [IU] via SUBCUTANEOUS
  Administered 2013-09-26: 7 [IU] via SUBCUTANEOUS
  Administered 2013-09-27: 20 [IU] via SUBCUTANEOUS
  Administered 2013-09-27: 7 [IU] via SUBCUTANEOUS
  Administered 2013-09-27: 4 [IU] via SUBCUTANEOUS
  Administered 2013-09-28: 11 [IU] via SUBCUTANEOUS
  Administered 2013-09-28: 4 [IU] via SUBCUTANEOUS

## 2013-09-26 MED ORDER — CIPROFLOXACIN HCL 500 MG PO TABS: 500.0000 mg | ORAL_TABLET | Freq: Two times a day (BID) | ORAL | Status: DC

## 2013-09-26 MED ORDER — INSULIN GLARGINE 100 UNIT/ML ~~LOC~~ SOLN
30.0000 [IU] | Freq: Every day | SUBCUTANEOUS | Status: DC
  Administered 2013-09-26 – 2013-09-27 (×2): 30 [IU] via SUBCUTANEOUS
  Filled 2013-09-26 (×3): qty 0.3

## 2013-09-26 MED ORDER — INSULIN ASPART 100 UNIT/ML ~~LOC~~ SOLN
0.0000 [IU] | Freq: Every day | SUBCUTANEOUS | Status: DC
  Administered 2013-09-27: 2 [IU] via SUBCUTANEOUS

## 2013-09-26 MED FILL — Heparin Sodium (Porcine) Inj 1000 Unit/ML: INTRAMUSCULAR | Qty: 30 | Status: AC

## 2013-09-26 MED FILL — Sodium Chloride IV Soln 0.9%: INTRAVENOUS | Qty: 2000 | Status: AC

## 2013-09-26 NOTE — Progress Notes (Signed)
I met with pt at bedside. 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 No medical insurance. She has only intermittent assistance of friends at d/c. Her 46 yo and 66 yo children live with their father and her siblings are out of state. I recommend SNF rehab until pt can reach Mod I level to eventually return home. Pt is in agreement. I have notified SW. I will sign off. [phone]

## 2013-09-26 NOTE — Progress Notes (Signed)
Inpatient Diabetes Program Recommendations  AACE/ADA: New Consensus Statement on Inpatient Glycemic Control (2013)  Target Ranges:  Prepandial:   less than 140 mg/dL      Peak postprandial:   less than 180 mg/dL (1-2 hours)      Critically ill patients:  140 - 180 mg/dL    Inpatient Diabetes Program Recommendations Insulin - Basal: Please increase lantus back to the home dose of 30 units at HS.  Please also add HS correction coverage.  Fasting glucose levels are high.  Thank you, Lenor Coffin, RN, CNS, Diabetes Coordinator [phone])
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
## 2013-09-26 NOTE — Progress Notes (Signed)
Subjective: Patient reports Doing much better she is ambulating pain is manageable  Objective: Vital signs in last 24 hours: Temp:  [98.3 F (36.8 C)-99 F (37.2 C)] 98.6 F (37 C) (09/08 0658) Pulse Rate:  [92-104] 100 (09/08 0658) Resp:  [18-20] 18 (09/08 0658) BP: (111-157)/(57-71) 157/70 mmHg (09/08 0658) SpO2:  [91 %-99 %] 94 % (09/08 0658)  Intake/Output from previous day: 09/07 0701 - 09/08 0700 In: -  Out: 9104 [Urine:8725; Drains:379] Intake/Output this shift:    Strength out of 5 wound is clean dry and intact  Lab Results: No results found for this basename: WBC, HGB, HCT, PLT,  in the last 72 hours BMET No results found for this basename: NA, K, CL, CO2, GLUCOSE, BUN, CREATININE, CALCIUM,  in the last 72 hours  Studies/Results: No results found.  Assessment/Plan: Continue to mobilize with physical occupational therapy social work to evaluate discharge needs  LOS: 3 days     Edwardo Wojnarowski P 09/26/2013, 7:38 AM

## 2013-09-26 NOTE — Clinical Social Work Note (Addendum)
CSW follow Up:   CSW met pt at bedside. CSW introduce self and purpose of the visit. Pt presented with a calm mood and normal affect. CSW and pt discussed SNF rehab. CSW and pt dicussed the transition process to SNF. Pt reported not having insurance and would like to talk to a financial counselor about her affairs. CSW left a voice message from Aleene Davidson [phone]) regarding pt's request. CSW will continue to follow pt as needed.   Addendum:  CSW contacted [facility] in [place], [place] Contact Person: Winnifred Friar  at [phone] phone [phone] fax  to confirmed a bed offer. Hilda Blades reported that she is willing to accepted the pt. Hilda Blades reported that she will have a bed until Thursday.      CSW met pt at bedside to present her with the bed offer. Pt reported that she would prefer going someone else, but understand that there are limit bed offer due to her no having insurance.    [place], MSW, [place]
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
## 2013-09-26 NOTE — Progress Notes (Signed)
Patients hemovac #1 (left posterior hip drain) has not put out any drainage from 7a-7p shift. Upon assessment at end of shift drain has become slightly pulled out and will no longer hold charge. Will remove drain once patient gets back in bed as it is no longer working.   Drain #2 (back) is still intact and charged. It has put a total of 140cc output from 7a-7p.  Patient also was sitting up in chair and took her TLSO brace off and refused to put it back on. RN explained the benefits and risks of not wearing brace but patient adamantly refused to put it back on. Will continue to monitor and encourage her to follow spinal precautions. Shamar Engelmann, [place], RN

## 2013-09-27 DIAGNOSIS — F329 Major depressive disorder, single episode, unspecified: Secondary | ICD-10-CM

## 2013-09-27 DIAGNOSIS — F411 Generalized anxiety disorder: Secondary | ICD-10-CM

## 2013-09-27 DIAGNOSIS — F3289 Other specified depressive episodes: Secondary | ICD-10-CM

## 2013-09-27 LAB — GLUCOSE, CAPILLARY
GLUCOSE-CAPILLARY: 220 mg/dL — AB (ref 70–99)
Glucose-Capillary: 374 mg/dL — ABNORMAL HIGH (ref 70–99)

## 2013-09-27 NOTE — Progress Notes (Signed)
Patient has been exceedingly anxious and irritable today.  She was heard on the phone using foul language so loud that people in the hall were aware.  Patient has been given her anxiety and pain medicine whenever she has asked but continues to be very anxious.  MD aware.  Lance Bosch, Rn

## 2013-09-27 NOTE — Progress Notes (Signed)
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 She does not have health insurance because she cannot afford it. The patient is having LOTS of issues with her ex-husband, paying child support, frequent court appearance and is stressed out. Patient stated that she is Susan Dunn being in control at all times and now she must depend on other people and people from the financial office are calling me. CM informed the patient that they are trying to find a way to assist her with this hospital bill or other ways they can help her financially - not to get in her business. CM encouraged patient to focus on the here and now. Focus on moving forward. Patient asked CM to call a coworker to help her with her car, bills, etc;  DCP- go to SNF when medically stable for rehab prior to going home; PCP is Dr Marvis Moeller, patient can pay for her prescriptions. LOTS of emotional support givenAlexis Goodell [phone]

## 2013-09-27 NOTE — Progress Notes (Signed)
Patient refused to remove foley.  Lance Bosch, RN
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
## 2013-09-27 NOTE — Progress Notes (Signed)
Consulted with nurse before visit. Unit Director shared information about patient social factors effecting patient. Patient shared with Chaplain family history, financial struggles, and "feeling lonely." Assessed patient support systems, and strategies for individual emotional support. Will follow as needed.    09/27/13 1600  Clinical Encounter Type  Visited With Patient  Visit Type Initial  Referral From Nurse  Spiritual Encounters  Spiritual Needs Emotional  Stress Factors  Patient Stress Factors Exhausted;Family relationships;Financial concerns;Health changes;Major life changes  Advance Directives (For Healthcare)  Does patient have an advance directive? No   Charmian Muff, Chaplain 4:24 PM 09/27/2013

## 2013-09-27 NOTE — Progress Notes (Signed)
Subjective: Patient reports She did well no leg pain back pain is still severe but manageable overall she does feel as tense all over  Objective: Vital signs in last 24 hours: Temp:  [98.1 F (36.7 C)-98.8 F (37.1 C)] 98.1 F (36.7 C) (09/09 1110) Pulse Rate:  [67-119] 119 (09/09 1110) Resp:  [16-20] 20 (09/09 1110) BP: (109-156)/(53-77) 139/77 mmHg (09/09 1110) SpO2:  [92 %-100 %] 100 % (09/09 1110)  Intake/Output from previous day: 09/08 0701 - 09/09 0700 In: -  Out: 3480 [Urine:3275; Drains:205] Intake/Output this shift: Total I/O In: -  Out: 2 [Urine:2]  Strength is 5 of 5 wounds are clean dry and intact  Lab Results:  Recent Labs  09/26/13 1700  WBC 9.6  HGB 9.5*  HCT 31.0*  PLT 378   BMET  Recent Labs  09/26/13 1700  NA 134*  K 4.4  CL 94*  CO2 27  GLUCOSE 270*  BUN 7  CREATININE 0.93  CALCIUM 9.2    Studies/Results: No results found.  Assessment/Plan: Continue PT OT I discontinued her iliac crest Hemovac drain I appreciate internal medicine's assistance and we will await a psychiatric consult for help in management of her anxiety plan discharge to [facility] in the morning  LOS: 4 days     Aleshia Cartelli P 09/27/2013, 2:08 PM

## 2013-09-27 NOTE — Progress Notes (Signed)
Dr. [name] pulled out the hemovac earlier today at iliac crest,  This RN discontinued the hemovac #1 under the NT's documentation.Susan Dunn
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
## 2013-09-27 NOTE — Progress Notes (Signed)
Inpatient Diabetes Program Recommendations  AACE/ADA: New Consensus Statement on Inpatient Glycemic Control (2013)  Target Ranges:  Prepandial:   less than 140 mg/dL      Peak postprandial:   less than 180 mg/dL (1-2 hours)      Critically ill patients:  140 - 180 mg/dL   Pt's glucose maintained in 200's range consistently: Please consider the following (Noted lantus increase to 30 units, thank you) Inpatient Diabetes Program Recommendations Insulin - Basal: Please increase lantus back to the home dose of 30 units at HS.  Insulin - Meal Coverage: Please add some meal coverage to correction scale. as pt takes at home-noted 1 unit per 7 grams carbohydrate.  Would recommend starting with 3 units meal coverage  Thank you, Lenor Coffin, RN, CNS, Diabetes Coordinator [phone])

## 2013-09-27 NOTE — Progress Notes (Signed)
Foley removed.  Patient tolerated well.  Lance Bosch, RN

## 2013-09-28 DIAGNOSIS — E162 Hypoglycemia, unspecified: Secondary | ICD-10-CM

## 2013-09-28 DIAGNOSIS — F411 Generalized anxiety disorder: Secondary | ICD-10-CM

## 2013-09-28 LAB — GLUCOSE, CAPILLARY
GLUCOSE-CAPILLARY: 180 mg/dL — AB (ref 70–99)
Glucose-Capillary: 182 mg/dL — ABNORMAL HIGH (ref 70–99)
Glucose-Capillary: 246 mg/dL — ABNORMAL HIGH (ref 70–99)
Glucose-Capillary: 284 mg/dL — ABNORMAL HIGH (ref 70–99)

## 2013-09-28 MED ORDER — OXYCODONE-ACETAMINOPHEN 5-325 MG PO TABS: 1.0000 | ORAL_TABLET | ORAL | Status: AC | PRN

## 2013-09-28 MED ORDER — SORBITOL 70 % SOLN
960.0000 mL | TOPICAL_OIL | Freq: Once | ORAL | Status: AC
  Administered 2013-09-28: 960 mL via RECTAL
  Filled 2013-09-28: qty 240

## 2013-09-28 MED ORDER — CITALOPRAM HYDROBROMIDE 40 MG PO TABS
40.0000 mg | ORAL_TABLET | Freq: Every day | ORAL | Status: DC
  Administered 2013-09-28: 40 mg via ORAL
  Filled 2013-09-28: qty 1

## 2013-09-28 MED ORDER — MAGNESIUM CITRATE PO SOLN
1.0000 | Freq: Once | ORAL | Status: AC
  Administered 2013-09-28: 1 via ORAL
  Filled 2013-09-28: qty 296

## 2013-09-28 MED ORDER — ALPRAZOLAM 0.25 MG PO TABS
0.2500 mg | ORAL_TABLET | Freq: Three times a day (TID) | ORAL | Status: DC | PRN
  Administered 2013-09-28: 0.25 mg via ORAL
  Filled 2013-09-28: qty 1

## 2013-09-28 NOTE — Progress Notes (Signed)
Report called to K. Creig Hines [facility] fundraiser at [facility]. Discharge education completed by RN. Pt received a copy of discharge paperwork and confirms understanding of follow up appointments and discharge medications. Pt denies any questions at this time. IV removed, site is within normal limits. Pt will discharge from the unit via ambulatory services.
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
## 2013-09-28 NOTE — Progress Notes (Signed)
Chaplain followed up with Patient. Chaplain entered room when Patient on phone and staff in room, but patient desired to speak with Chaplain. Patient identified spiritual needs to have "deeper" relationship with God. Patient identified that she would like to seek counseling resources in [place] area upon discharge. Chaplain prayed with patient for "peace of mind" as Patient requested. Chaplain will attempt to find information, but also left a card.  09/28/13 1000  Clinical Encounter Type  Visited With Patient  Visit Type Follow-up;Spiritual support  Recommendations Patient expresses desire to be connected to counseling resources in [facility]  Spiritual Encounters  Spiritual Needs Emotional;Prayer  Stress Factors  Patient Stress Factors Exhausted;Family relationships;Financial concerns;Health changes;Loss of control;Major life changes  Aerianna Losey, Mayer Masker, Chaplain 09/28/2013 10:23 AM

## 2013-09-28 NOTE — Discharge Instructions (Signed)
No lifting no bending no twisting keep the Steri-Strips on and dry cover with Saran wrap for showers only
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
## 2013-09-28 NOTE — Progress Notes (Signed)
Patient ID: Susan Dunn, female   DOB: [birth year], 48 y.o.   MRN: [id number] Doing very well pain much better controlled now she doesn't feel quite as anxious strength out of 5 wound clean dry and intact  I discontinued her Hemovac and we'll plan transfer to [facility]

## 2013-10-06 LAB — POCT I-STAT 4, (NA,K, GLUC, HGB,HCT)
GLUCOSE: 272 mg/dL — AB (ref 70–99)
Glucose, Bld: 232 mg/dL — ABNORMAL HIGH (ref 70–99)
HCT: 25 % — ABNORMAL LOW (ref 36.0–46.0)
HCT: 32 % — ABNORMAL LOW (ref 36.0–46.0)
HEMOGLOBIN: 8.5 g/dL — AB (ref 12.0–15.0)
Hemoglobin: 10.9 g/dL — ABNORMAL LOW (ref 12.0–15.0)
POTASSIUM: 4.1 meq/L (ref 3.7–5.3)
Potassium: 4.2 mEq/L (ref 3.7–5.3)
Sodium: 133 mEq/L — ABNORMAL LOW (ref 137–147)
Sodium: 136 mEq/L — ABNORMAL LOW (ref 137–147)
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
## 2023-06-20 DEATH — deceased
